# Patient Record
Sex: Female | Born: 1965 | Race: White | Hispanic: No | Marital: Married | Smoking: Never smoker
Health system: Southern US, Community
[De-identification: ages and names within clinical notes are randomized; demographics above are authoritative.]

## PROBLEM LIST (undated history)

## (undated) DIAGNOSIS — I10 Essential (primary) hypertension: Secondary | ICD-10-CM

---

## 1998-10-18 ENCOUNTER — Other Ambulatory Visit: Admission: RE | Admit: 1998-10-18 | Discharge: 1998-10-18 | Payer: Self-pay | Admitting: Obstetrics & Gynecology

## 1998-11-19 ENCOUNTER — Inpatient Hospital Stay (HOSPITAL_COMMUNITY): Admission: AD | Admit: 1998-11-19 | Discharge: 1998-11-19 | Payer: Self-pay | Admitting: Obstetrics and Gynecology

## 1999-05-13 ENCOUNTER — Inpatient Hospital Stay (HOSPITAL_COMMUNITY): Admission: AD | Admit: 1999-05-13 | Discharge: 1999-05-15 | Payer: Self-pay | Admitting: Obstetrics & Gynecology

## 1999-05-13 ENCOUNTER — Encounter (INDEPENDENT_AMBULATORY_CARE_PROVIDER_SITE_OTHER): Payer: Self-pay | Admitting: Specialist

## 1999-06-21 ENCOUNTER — Other Ambulatory Visit: Admission: RE | Admit: 1999-06-21 | Discharge: 1999-06-21 | Payer: Self-pay | Admitting: Obstetrics & Gynecology

## 2000-12-03 ENCOUNTER — Other Ambulatory Visit: Admission: RE | Admit: 2000-12-03 | Discharge: 2000-12-03 | Payer: Self-pay | Admitting: Obstetrics & Gynecology

## 2001-09-10 ENCOUNTER — Other Ambulatory Visit: Admission: RE | Admit: 2001-09-10 | Discharge: 2001-09-10 | Payer: Self-pay | Admitting: Obstetrics & Gynecology

## 2001-12-07 ENCOUNTER — Ambulatory Visit (HOSPITAL_COMMUNITY): Admission: RE | Admit: 2001-12-07 | Discharge: 2001-12-07 | Payer: Self-pay | Admitting: Obstetrics & Gynecology

## 2002-02-25 ENCOUNTER — Inpatient Hospital Stay (HOSPITAL_COMMUNITY): Admission: AD | Admit: 2002-02-25 | Discharge: 2002-03-01 | Payer: Self-pay | Admitting: Obstetrics & Gynecology

## 2002-04-04 ENCOUNTER — Other Ambulatory Visit: Admission: RE | Admit: 2002-04-04 | Discharge: 2002-04-04 | Payer: Self-pay | Admitting: Obstetrics & Gynecology

## 2003-05-19 ENCOUNTER — Other Ambulatory Visit: Admission: RE | Admit: 2003-05-19 | Discharge: 2003-05-19 | Payer: Self-pay | Admitting: Obstetrics & Gynecology

## 2004-09-18 ENCOUNTER — Other Ambulatory Visit: Admission: RE | Admit: 2004-09-18 | Discharge: 2004-09-18 | Payer: Self-pay | Admitting: Obstetrics & Gynecology

## 2007-01-14 ENCOUNTER — Ambulatory Visit (HOSPITAL_COMMUNITY): Admission: RE | Admit: 2007-01-14 | Discharge: 2007-01-14 | Payer: Self-pay | Admitting: Obstetrics & Gynecology

## 2008-09-18 IMAGING — MG MM DIGITAL SCREENING BILAT W/ CAD
4 series · 4 of 4 positions shown · non-contrast
Comparison: none

DG SCREEN MAMMOGRAM BILATERAL
Bilateral CC and MLO view(s) were taken.

DIGITAL SCREENING MAMMOGRAM WITH CAD:
There are scattered fibroglandular densities.  No masses or malignant type calcifications are 
identified.  Compared with prior studies.

[R CC]
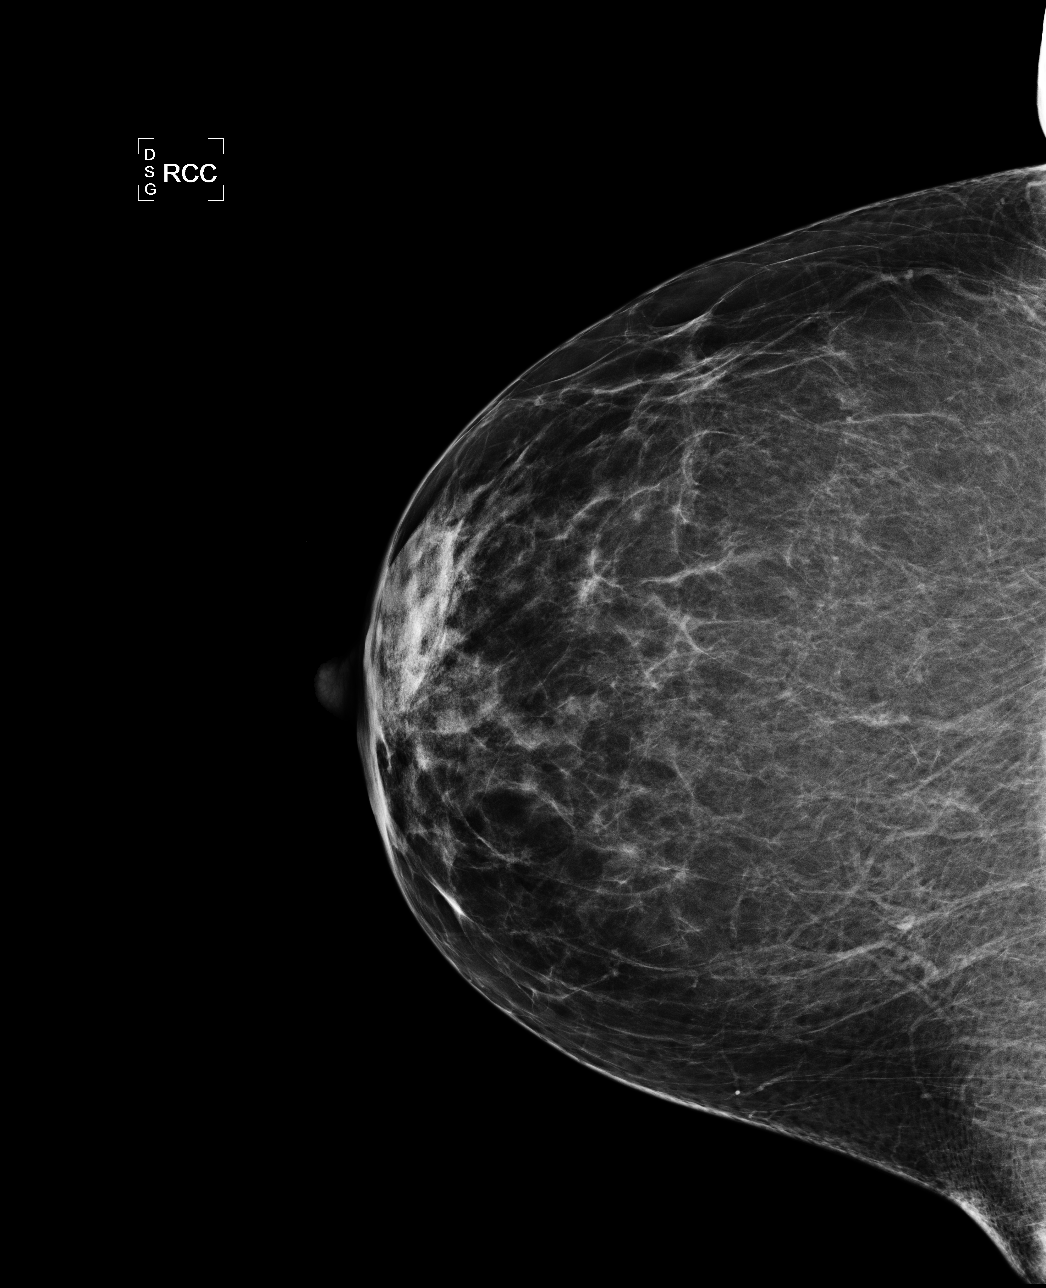

[R MLO]
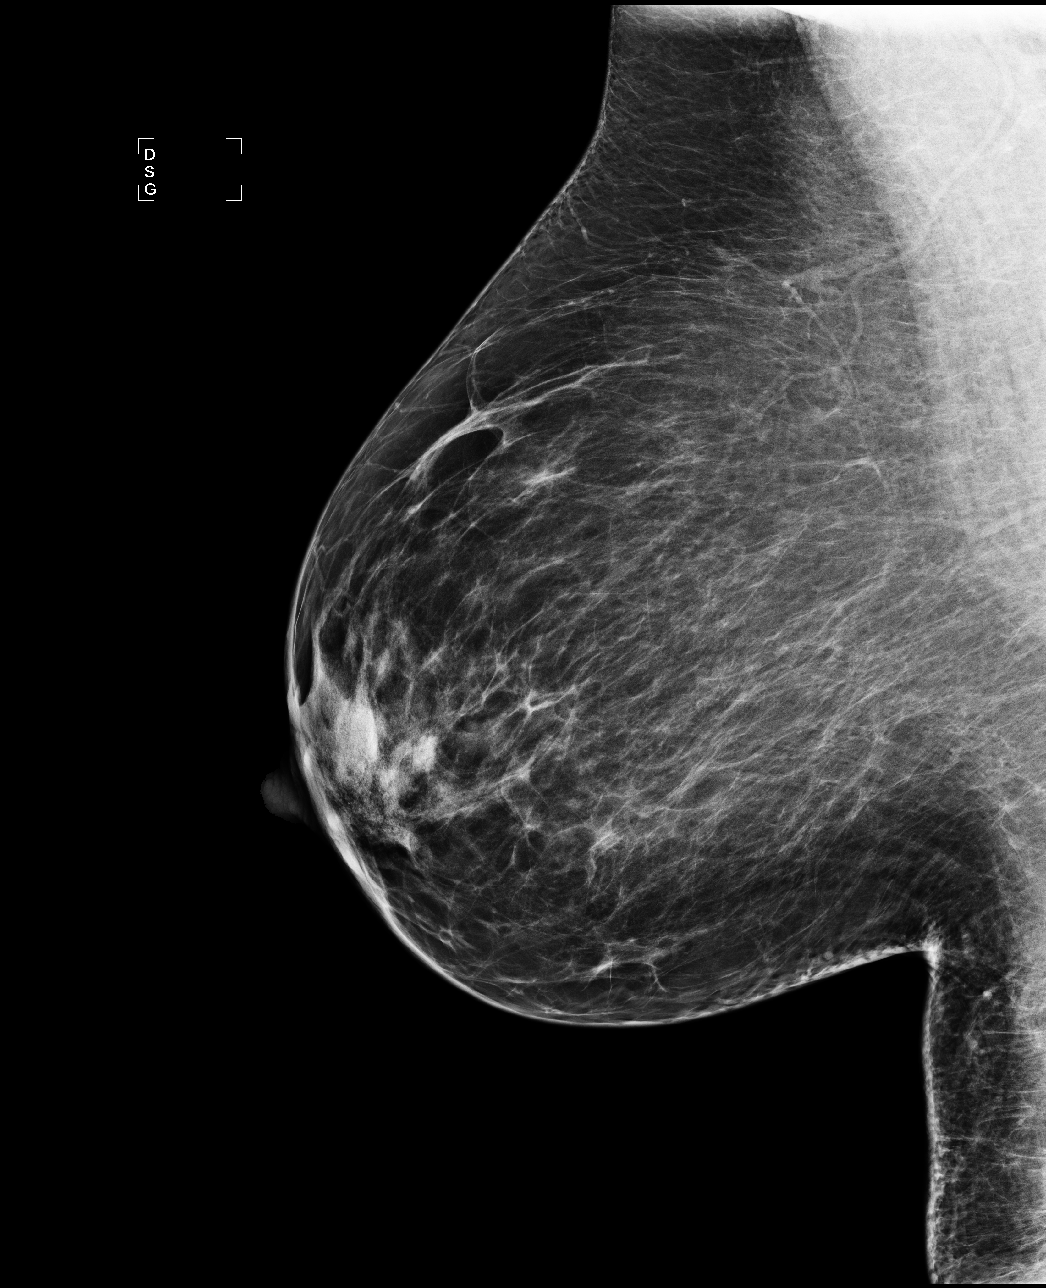

[L CC]
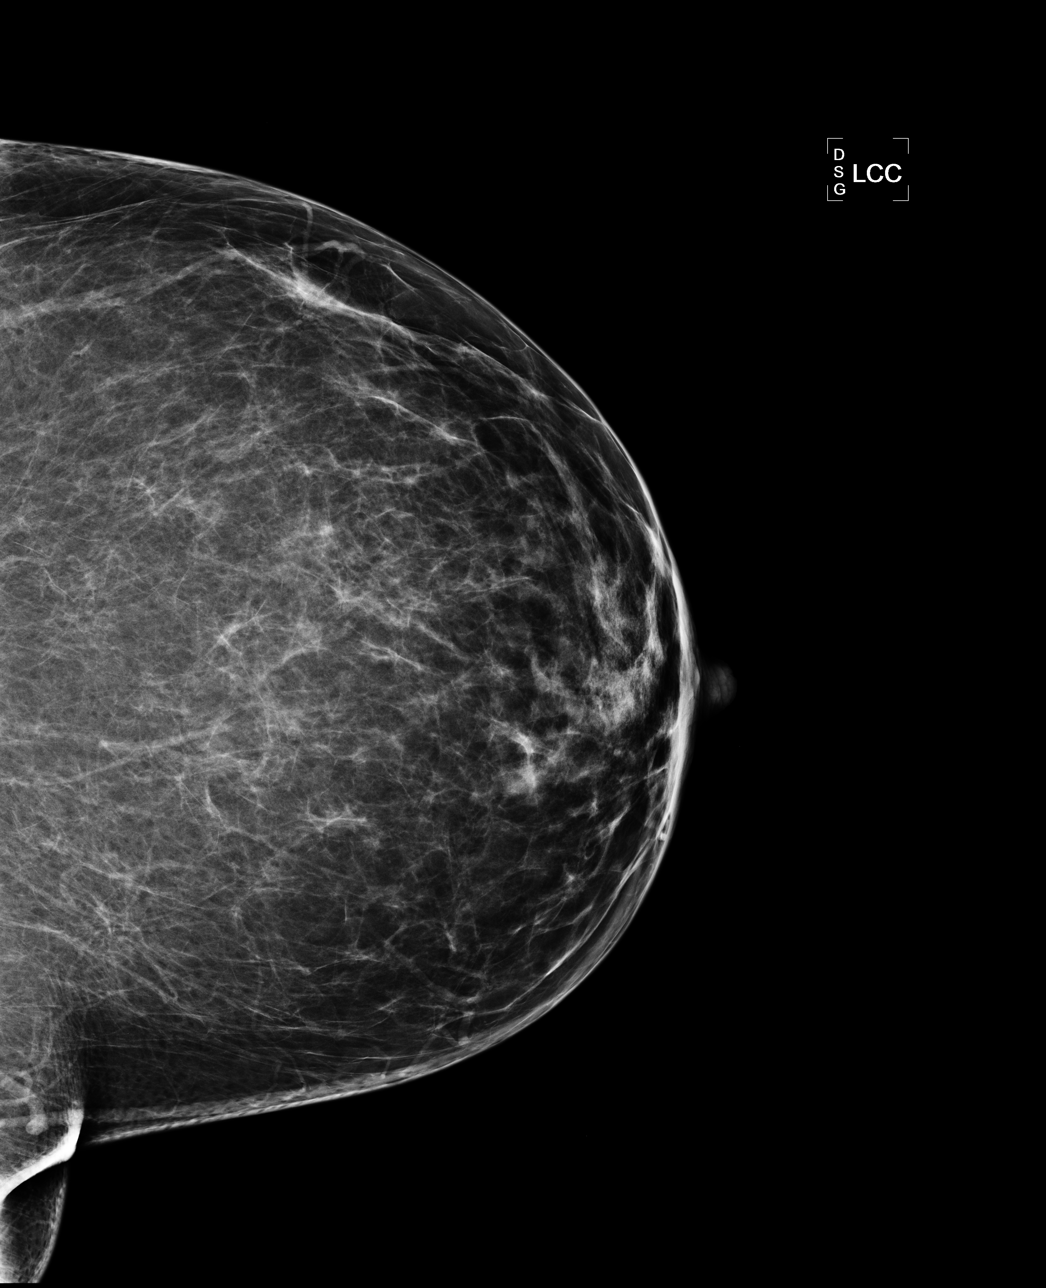

[L MLO]
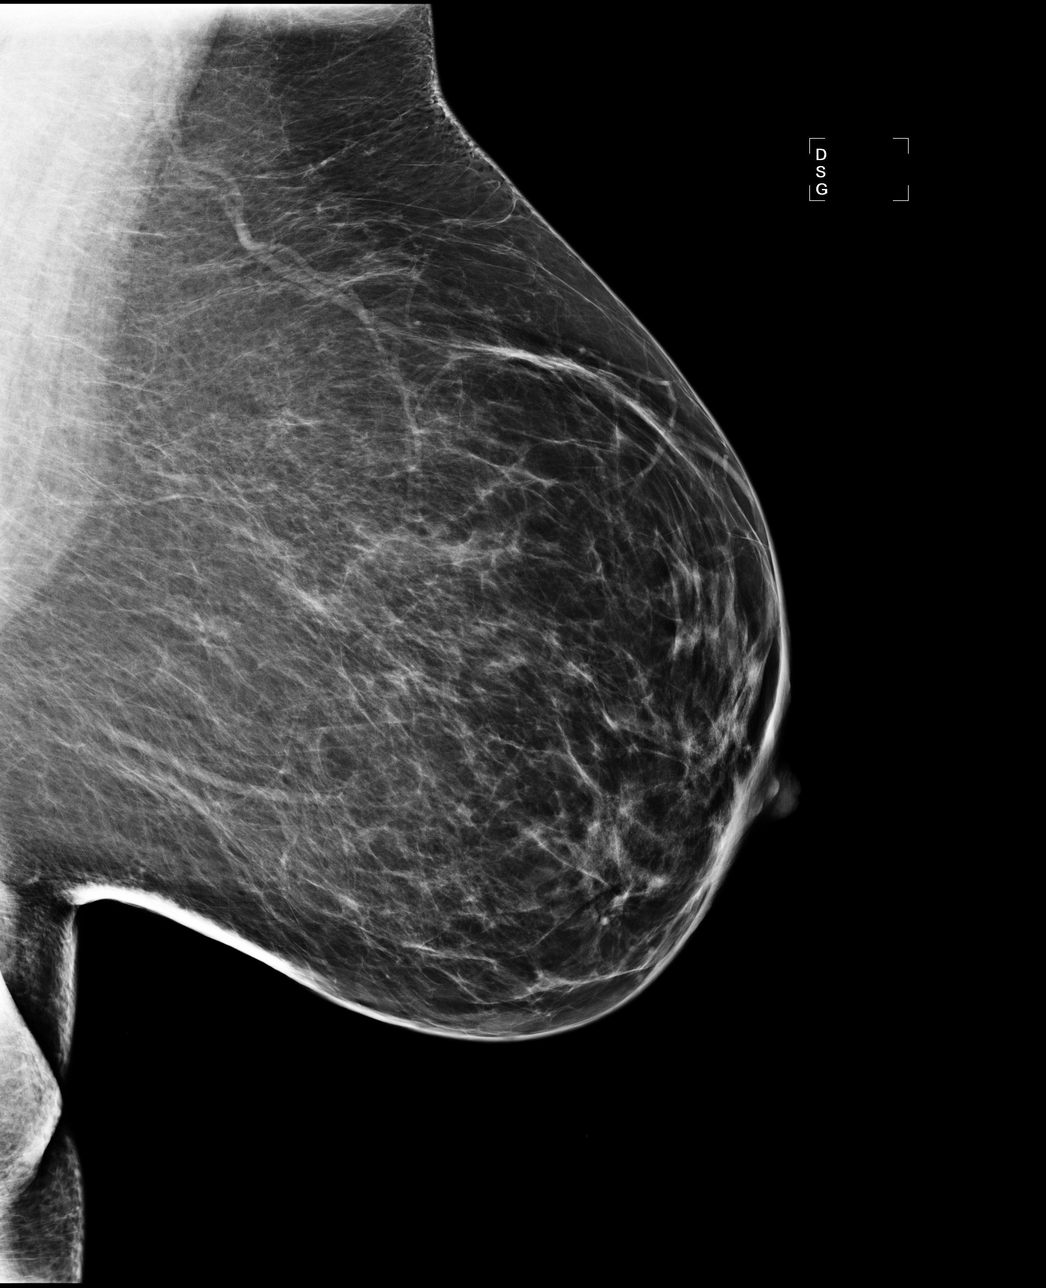

[4 of 4 positions shown; findings below may reference images not displayed]

IMPRESSION: No specific mammographic evidence of malignancy.  Next screening mammogram is recommended in one 
year.

ASSESSMENT: Negative - BI-RADS 1

Screening mammogram in 1 year.
ANALYZED BY COMPUTER AIDED DETECTION. , THIS PROCEDURE WAS A DIGITAL MAMMOGRAM.

## 2010-08-16 NOTE — H&P (Signed)
   NAME:  Alexandra Keller, Alexandra Keller NO.:  [id number]   MEDICAL RECORD NO.:  [id number]                   PATIENT TYPE:  INP   LOCATION:  9167                                 FACILITY:  [facility]   PHYSICIAN:  Tracie Harrier, M.D.              DATE OF BIRTH:  January 17, 1966   DATE OF ADMISSION:  02/25/2002  DATE OF DISCHARGE:                                HISTORY & PHYSICAL   HISTORY OF PRESENT ILLNESS:  The patient is a 45 year old female, gravida 4,  para 3, at [redacted] weeks gestation admitted for induction of labor due to fetal  macrosomia.  The patient had an ultrasound three weeks ago which showed an  estimated fetal weight of 8 pounds 2 ounces.   Her pregnancy has been complicated by advanced maternal age.  She had a  normal AFB with this pregnancy, and declined an amniocentesis.  Her  pregnancy otherwise has been pretty uneventful.  She does have a history of  positive Group B Strep vaginal colonization.   LABORATORY DATA:  Maternal blood type A negative.  Rubella immune.  Positive  Group B Strep.  Glucola unknown.   PAST MEDICAL HISTORY:  History of cervical dysplasia.   PAST SURGICAL HISTORY:  1. Cryotherapy.  2. Wisdom teeth extraction.   PAST OBSTETRICAL HISTORY:  Normal spontaneous vaginal delivery at term x3.   MEDICATIONS:  Prenatal vitamins.   ALLERGIES:  1. E-MYCIN.  2. CODEINE.   PHYSICAL EXAMINATION:  VITAL SIGNS:  Stable.  Temperature 97, blood pressure  110/64, fetal heart tones 140's and reassuring.  GENERAL:  She is a well-developed, well-nourished female in no acute  distress.  HEENT:  Within normal limits.  NECK:  Supple without adenopathy or thyromegaly.  HEART:  Regular rate and rhythm with a 2/6 systolic ejection murmur.  LUNGS:  Clear.  BREASTS:  Deferred.  ABDOMEN:  Gravid and nontender.  EXTREMITIES:  Grossly normal.  NEUROLOGIC:  Grossly normal.  PELVIC:  Normal external female genitalia.  Cervix closed, 50%, vertex  presentation, high.   ADMISSION DIAGNOSES:  1. Intrauterine pregnancy at term.  2. Fetal macrosomia (estimated fetal weight about 9-1/2 pounds).   PLAN:  1. Induction of labor.  2. Intravenous antibiotics for Group B Strep vaginal colonization.  3. Expect normal spontaneous vaginal delivery.                                                 Tracie Harrier, M.D.    REG/MEDQ  D:  02/26/2002  T:  02/26/2002  Job:  [id number]

## 2011-12-24 ENCOUNTER — Emergency Department (HOSPITAL_COMMUNITY)
Admission: EM | Admit: 2011-12-24 | Discharge: 2011-12-24 | Disposition: A | Discharge status: Home / Self Care | Payer: Self-pay | Source: Home / Self Care | Attending: Emergency Medicine | Admitting: Emergency Medicine

## 2011-12-24 ENCOUNTER — Encounter (HOSPITAL_COMMUNITY): Payer: Self-pay

## 2011-12-24 DIAGNOSIS — I10 Essential (primary) hypertension: Secondary | ICD-10-CM

## 2011-12-24 DIAGNOSIS — M7580 Other shoulder lesions, unspecified shoulder: Secondary | ICD-10-CM

## 2011-12-24 LAB — POCT I-STAT, CHEM 8
BUN: 13 mg/dL (ref 6–23)
Calcium, Ion: 1.25 mmol/L — ABNORMAL HIGH (ref 1.12–1.23)
Chloride: 102 mEq/L (ref 96–112)
HCT: 40 % (ref 36.0–46.0)
Sodium: 140 mEq/L (ref 135–145)
TCO2: 25 mmol/L (ref 0–100)

## 2011-12-24 MED ORDER — HYDROCHLOROTHIAZIDE 25 MG PO TABS: 25.0000 mg | ORAL_TABLET | Freq: Every day | ORAL | Status: AC

## 2011-12-24 MED ORDER — TRAMADOL HCL 50 MG PO TABS: 100.0000 mg | ORAL_TABLET | Freq: Three times a day (TID) | ORAL | Status: AC | PRN

## 2011-12-24 NOTE — ED Notes (Signed)
Patient states left arm pain woke her up this morning and that she is having some neck and head pain, co worker took her BP this am was 162/100

## 2011-12-24 NOTE — ED Provider Notes (Signed)
Chief Complaint  Patient presents with  . Hypertension    History of Present Illness:  Alexandra Keller is here today for 2 issues, the first being high blood pressure in the second left shoulder pain.  She has a history of high blood pressure in the past. She was on medication years ago but she stopped it. She has not taken any medications at least 5 years. She got concerned about the shoulder pain and had a coworker take her blood pressure today and it was elevated at 162/101, 162/98, and 162/100. She's had some headaches, blurry vision, and ankles swell. She denies any history or shortness of breath, chest pain, tightness, pressure, palpitations, dizziness, or syncope. She denies any strokelike symptoms. She's had no history of diabetes, elevated cholesterol, cigarette smoking, or kidney disease. Both her parents have high blood pressure and both have coronary artery disease. Her mother died at age 25 following complications of a heart cath. Her father died at age 50 of a fall.  Her second complaint has been pain in the left shoulder. She woke up with it this morning. It hurts to lie on that side. She denies any injury. It hurts with abduction. The pain radiates into the upper arm but not below the elbow. She denies any numbness, tingling, weakness in the arm.  Review of Systems:  Other than noted above, the patient denies any of the following symptoms: Systemic:  No fever, chills, fatigue, weight loss or gain. Respiratory:  No coughing, wheezing, or shortness of breath. Cardiac:  No chest pain, tightness, pressure, palpitations, syncope, or edema. Neuro:  No headache, dizziness, blurred vision, weakness, paresthesias, or strokelike symptoms.  PMFSH:  Past medical history, family history, social history, meds, and allergies were reviewed.  Physical Exam:   Vital signs:  BP 190/82  Pulse 65  Temp 97.9 F (36.6 C) (Oral)  Resp 16  SpO2 100% General:  Alert, oriented, in no distress. Lungs:  Breath  sounds clear and equal bilaterally.  No wheezes, rales, or rhonchi. Heart:  Regular rhythm, no gallops, murmers, clicks or rubs.  Abdomen:  Soft and flat.  Nontender, no organomegaly or mass.  No pulsatile midline abdominal mass or bruit. Ext:  No edema, pulses full. Exam of the left shoulder reveals no pain to palpation and a full range of motion with slight pain on abduction and positive impingement signs.  Labs:   Results for orders placed during the hospital encounter of 12/24/11  POCT I-STAT, CHEM 8      Component Value Range   Sodium 140  135 - 145 mEq/L   Potassium 4.1  3.5 - 5.1 mEq/L   Chloride 102  96 - 112 mEq/L   BUN 13  6 - 23 mg/dL   Creatinine, Ser 1.61  0.50 - 1.10 mg/dL   Glucose, Bld 96  70 - 99 mg/dL   Calcium, Ion 0.96 (*) 1.12 - 1.23 mmol/L   TCO2 25  0 - 100 mmol/L   Hemoglobin 13.6  12.0 - 15.0 g/dL   HCT 04.5  40.9 - 81.1 %     Date: 12/24/2011  Rate: 60  Rhythm: normal sinus rhythm  QRS Axis: normal  Intervals: normal  ST/T Wave abnormalities: normal  Conduction Disutrbances:none  Narrative Interpretation: Normal sinus rhythm, normal EKG.  Old EKG Reviewed: none available  Assessment:  The primary encounter diagnosis was Hypertension. A diagnosis of Rotator cuff tendonitis was also pertinent to this visit.  She needs treatment for her hypertension, so hydrochlorothiazide  was started. I suggested she followup with her primary care physician within the week.  For her rotator cuff tendinitis, want to avoid anti-inflammatories since her blood pressure is so high. I started her on some shoulder exercises and gave her tramadol for the pain. If this keeps on, suggest she return here for followup.  Plan:   1.  The following meds were prescribed:   New Prescriptions   HYDROCHLOROTHIAZIDE (HYDRODIURIL) 25 MG TABLET    Take 1 tablet (25 mg total) by mouth daily.   HYDROCHLOROTHIAZIDE (HYDRODIURIL) 25 MG TABLET    Take 1 tablet (25 mg total) by mouth daily.    TRAMADOL (ULTRAM) 50 MG TABLET    Take 2 tablets (100 mg total) by mouth every 8 (eight) hours as needed for pain.   TRAMADOL (ULTRAM) 50 MG TABLET    Take 2 tablets (100 mg total) by mouth every 8 (eight) hours as needed for pain.   2.  The patient was instructed in symptomatic care and handouts were given. 3.  The patient was told to return if becoming worse in any way, if no better in 3 or 4 days, and given some red flag symptoms that would indicate earlier return.    Reuben Likes, MD 12/24/11 2214

## 2012-04-13 ENCOUNTER — Other Ambulatory Visit: Payer: Self-pay | Admitting: Family Medicine

## 2012-04-13 DIAGNOSIS — Z1231 Encounter for screening mammogram for malignant neoplasm of breast: Secondary | ICD-10-CM

## 2012-04-19 ENCOUNTER — Ambulatory Visit
Admission: RE | Admit: 2012-04-19 | Discharge: 2012-04-19 | Disposition: A | Discharge status: Home / Self Care | Payer: BC Managed Care – PPO | Source: Ambulatory Visit | Attending: Family Medicine | Admitting: Family Medicine

## 2012-04-19 DIAGNOSIS — Z1231 Encounter for screening mammogram for malignant neoplasm of breast: Secondary | ICD-10-CM

## 2013-11-20 ENCOUNTER — Encounter (HOSPITAL_BASED_OUTPATIENT_CLINIC_OR_DEPARTMENT_OTHER): Payer: Self-pay | Admitting: Emergency Medicine

## 2013-11-20 ENCOUNTER — Emergency Department (HOSPITAL_BASED_OUTPATIENT_CLINIC_OR_DEPARTMENT_OTHER): Payer: Managed Care, Other (non HMO)

## 2013-11-20 ENCOUNTER — Emergency Department (HOSPITAL_BASED_OUTPATIENT_CLINIC_OR_DEPARTMENT_OTHER)
Admission: EM | Admit: 2013-11-20 | Discharge: 2013-11-20 | Disposition: A | Discharge status: Home / Self Care | Payer: Managed Care, Other (non HMO) | Attending: Emergency Medicine | Admitting: Emergency Medicine

## 2013-11-20 DIAGNOSIS — Y929 Unspecified place or not applicable: Secondary | ICD-10-CM | POA: Diagnosis not present

## 2013-11-20 DIAGNOSIS — Y9301 Activity, walking, marching and hiking: Secondary | ICD-10-CM | POA: Insufficient documentation

## 2013-11-20 DIAGNOSIS — S8000XA Contusion of unspecified knee, initial encounter: Secondary | ICD-10-CM | POA: Diagnosis not present

## 2013-11-20 DIAGNOSIS — W1809XA Striking against other object with subsequent fall, initial encounter: Secondary | ICD-10-CM | POA: Insufficient documentation

## 2013-11-20 DIAGNOSIS — IMO0002 Reserved for concepts with insufficient information to code with codable children: Secondary | ICD-10-CM | POA: Insufficient documentation

## 2013-11-20 DIAGNOSIS — S199XXA Unspecified injury of neck, initial encounter: Secondary | ICD-10-CM | POA: Diagnosis not present

## 2013-11-20 DIAGNOSIS — S0990XA Unspecified injury of head, initial encounter: Secondary | ICD-10-CM | POA: Diagnosis present

## 2013-11-20 DIAGNOSIS — W19XXXA Unspecified fall, initial encounter: Secondary | ICD-10-CM

## 2013-11-20 DIAGNOSIS — S0181XA Laceration without foreign body of other part of head, initial encounter: Secondary | ICD-10-CM

## 2013-11-20 DIAGNOSIS — S0180XA Unspecified open wound of other part of head, initial encounter: Secondary | ICD-10-CM | POA: Diagnosis not present

## 2013-11-20 DIAGNOSIS — S8001XA Contusion of right knee, initial encounter: Secondary | ICD-10-CM

## 2013-11-20 DIAGNOSIS — I10 Essential (primary) hypertension: Secondary | ICD-10-CM | POA: Insufficient documentation

## 2013-11-20 DIAGNOSIS — Z79899 Other long term (current) drug therapy: Secondary | ICD-10-CM | POA: Insufficient documentation

## 2013-11-20 DIAGNOSIS — S0993XA Unspecified injury of face, initial encounter: Secondary | ICD-10-CM | POA: Insufficient documentation

## 2013-11-20 DIAGNOSIS — W010XXA Fall on same level from slipping, tripping and stumbling without subsequent striking against object, initial encounter: Secondary | ICD-10-CM | POA: Insufficient documentation

## 2013-11-20 HISTORY — DX: Essential (primary) hypertension: I10

## 2013-11-20 MED ORDER — HYDROCODONE-ACETAMINOPHEN 5-325 MG PO TABS
1.0000 | ORAL_TABLET | Freq: Once | ORAL | Status: AC
  Administered 2013-11-20: 1 via ORAL
  Filled 2013-11-20: qty 1

## 2013-11-20 MED ORDER — HYDROCODONE-ACETAMINOPHEN 5-325 MG PO TABS: 1.0000 | ORAL_TABLET | ORAL | Status: AC | PRN

## 2013-11-20 NOTE — ED Notes (Signed)
Patient transported to CT 

## 2013-11-20 NOTE — ED Notes (Signed)
Pt reports tripping over uneven concrete this evening causing her to fall and hit her head on the ground - pt sustained laceration to rt eye brow, bleeding ceased at this time. Pt denies any LOC, dizziness or n/v.

## 2013-11-20 NOTE — ED Provider Notes (Signed)
CSN: [id number]     Arrival date & time 11/20/13  2011 History   First MD Initiated Contact with Patient 11/20/13 2043     Chief Complaint  Patient presents with  . Facial Laceration  . Head Injury     (Consider location/radiation/quality/duration/timing/severity/associated sxs/prior Treatment) Patient is a 48 y.o. female presenting with head injury. The history is provided by the patient. No language interpreter was used.  Head Injury Location:  R temporal Mechanism of injury: fall   Pain details:    Severity:  Moderate Chronicity:  New Associated symptoms: headache and neck pain   Associated symptoms: no nausea and no vomiting   Associated symptoms comment:  She fell while walking, hitting her right side on concrete causing facial laceration and headache. She denies visual changes but reports photophobia. No LOC. She denies N, V. She complains of mild neck pain - worse over time - bilateral hand abrasions and right knee pain. She has been ambulatory since the fall. No chest/abdominal pain.   Past Medical History  Diagnosis Date  . Hypertension    History reviewed. No pertinent past surgical history. History reviewed. No pertinent family history. History  Substance Use Topics  . Smoking status: Never Smoker   . Smokeless tobacco: Not on file  . Alcohol Use: Yes   OB History   Grav Para Term Preterm Abortions TAB SAB Ect Mult Living                 Review of Systems  Constitutional: Negative for fever and fatigue.  Eyes: Positive for photophobia. Negative for visual disturbance.  Respiratory: Negative for shortness of breath.   Cardiovascular: Negative for chest pain.  Gastrointestinal: Negative for nausea, vomiting and abdominal pain.  Musculoskeletal: Positive for neck pain. Negative for back pain.       See HPI.  Skin: Positive for wound.  Neurological: Positive for headaches. Negative for syncope.  Psychiatric/Behavioral: Negative for confusion.       Allergies  Codeine and Erythromycin  Home Medications   Prior to Admission medications   Medication Sig Start Date End Date Taking? Authorizing Provider  FLUoxetine (PROZAC) 20 MG capsule Take 20 mg by mouth daily.   Yes Historical Provider, MD  lisinopril (PRINIVIL,ZESTRIL) 20 MG tablet Take 20 mg by mouth daily.   Yes Historical Provider, MD  omeprazole (PRILOSEC) 10 MG capsule Take 10 mg by mouth daily.   Yes Historical Provider, MD  traMADol (ULTRAM) 50 MG tablet Take 2 tablets (100 mg total) by mouth every 8 (eight) hours as needed for pain. 12/24/11  Yes Reuben Likes, MD  hydrochlorothiazide (HYDRODIURIL) 25 MG tablet Take 1 tablet (25 mg total) by mouth daily. 12/24/11   Reuben Likes, MD  hydrochlorothiazide (HYDRODIURIL) 25 MG tablet Take 1 tablet (25 mg total) by mouth daily. 12/24/11   Reuben Likes, MD  traMADol (ULTRAM) 50 MG tablet Take 2 tablets (100 mg total) by mouth every 8 (eight) hours as needed for pain. 12/24/11   Reuben Likes, MD   BP 133/75  Pulse 87  Temp(Src) 97.8 F (36.6 C)  Resp 18  Ht  (1.626 m)  Wt 230 lb (104.327 kg)  BMI 39.46 kg/m2  SpO2 100%  LMP 10/20/2013 Physical Exam  Constitutional: She is oriented to person, place, and time. She appears well-developed and well-nourished.  Neck: Normal range of motion.  Pulmonary/Chest: Effort normal. She exhibits no tenderness.  Abdominal: There is no tenderness.  Musculoskeletal: Normal range  of motion.  Right knee: ecchymosis anteriorly. No bony deformity. Joint stable without laxity. Normal tendon function. Hands: FROM all digits and wrists.  Neck: Mild midline and bilateral paracervical tenderness. No swelling. FROM.   Neurological: She is alert and oriented to person, place, and time.  Skin: Skin is warm and dry.  1 cm laceration right lateral eye brow without hematoma. Superficial abrasions to palms of hands bilaterally.    ED Course  Procedures (including critical care  time) Labs Review Labs Reviewed - No data to display  Imaging Review No results found. Ct Head Wo Contrast  11/20/2013   CLINICAL DATA:  Right eyebrow laceration after falling and hitting her head on the ground.  EXAM: CT HEAD WITHOUT CONTRAST  CT CERVICAL SPINE WITHOUT CONTRAST  TECHNIQUE: Multidetector CT imaging of the head and cervical spine was performed following the standard protocol without intravenous contrast. Multiplanar CT image reconstructions of the cervical spine were also generated.  COMPARISON:  None.  FINDINGS: CT HEAD FINDINGS  Normal appearing cerebral hemispheres and posterior fossa structures. Normal size and position of the ventricles. No skull fracture, intracranial hemorrhage or paranasal sinus air-fluid levels.  CT CERVICAL SPINE FINDINGS  Mild dextro convex cervicothoracic scoliosis. Reversal of the normal cervical lordosis. Moderate anterior and posterior spur formation at the C5-6 level. Minimal anterior and mild posterior spur formation at the C4-5 level. No prevertebral soft tissue swelling, fractures or subluxations.  IMPRESSION: 1. Normal head CT. 2. No cervical spine fracture or subluxation. 3. Cervical spine degenerative changes, reversal of the normal cervical lordosis and mild scoliosis.   Electronically Signed   By: Gordan Payment M.D.   On: 11/20/2013 22:24   Ct Cervical Spine Wo Contrast  11/20/2013   CLINICAL DATA:  Right eyebrow laceration after falling and hitting her head on the ground.  EXAM: CT HEAD WITHOUT CONTRAST  CT CERVICAL SPINE WITHOUT CONTRAST  TECHNIQUE: Multidetector CT imaging of the head and cervical spine was performed following the standard protocol without intravenous contrast. Multiplanar CT image reconstructions of the cervical spine were also generated.  COMPARISON:  None.  FINDINGS: CT HEAD FINDINGS  Normal appearing cerebral hemispheres and posterior fossa structures. Normal size and position of the ventricles. No skull fracture, intracranial  hemorrhage or paranasal sinus air-fluid levels.  CT CERVICAL SPINE FINDINGS  Mild dextro convex cervicothoracic scoliosis. Reversal of the normal cervical lordosis. Moderate anterior and posterior spur formation at the C5-6 level. Minimal anterior and mild posterior spur formation at the C4-5 level. No prevertebral soft tissue swelling, fractures or subluxations.  IMPRESSION: 1. Normal head CT. 2. No cervical spine fracture or subluxation. 3. Cervical spine degenerative changes, reversal of the normal cervical lordosis and mild scoliosis.   Electronically Signed   By: Gordan Payment M.D.   On: 11/20/2013 22:24    EKG Interpretation None     LACERATION REPAIR Performed by: Elpidio Anis A Authorized by: Elpidio Anis A Consent: Verbal consent obtained. Risks and benefits: risks, benefits and alternatives were discussed Consent given by: patient Patient identity confirmed: provided demographic data Prepped and Draped in normal sterile fashion Wound explored  Laceration Location: right lateral eye brow  Laceration Length: 2cm  No Foreign Bodies seen or palpated  Anesthesia: local infiltration  Local anesthetic: lidocaine 2% w/o epinephrine  Anesthetic total: 1 ml  Irrigation method: syringe Amount of cleaning: standard  Skin closure: 6-0 prolene  Number of sutures: 4  Technique: simple interrupted  Patient tolerance: Patient tolerated the procedure well  with no immediate complications.  MDM   Final diagnoses:  None    1. Facial laceration 2. Fall 3. Knee contusion  No evidence IC head injury or neck fracture on CT's. Laceration repaired. Feel injuries result of mechanical fall. Stable for discharge.   Arnoldo Hooker, PA-C 11/20/13 2317

## 2013-11-20 NOTE — Discharge Instructions (Signed)
Cryotherapy °Cryotherapy means treatment with cold. Ice or gel packs can be used to reduce both pain and swelling. Ice is the most helpful within the first 24 to 48 hours after an injury or flare-up from overusing a muscle or joint. Sprains, strains, spasms, burning pain, shooting pain, and aches can all be eased with ice. Ice can also be used when recovering from surgery. Ice is effective, has very few side effects, and is safe for most people to use. °PRECAUTIONS  °Ice is not a safe treatment option for people with: °· Raynaud phenomenon. This is a condition affecting small blood vessels in the extremities. Exposure to cold may cause your problems to return. °· Cold hypersensitivity. There are many forms of cold hypersensitivity, including: °¨ Cold urticaria. Red, itchy hives appear on the skin when the tissues begin to warm after being iced. °¨ Cold erythema. This is a red, itchy rash caused by exposure to cold. °¨ Cold hemoglobinuria. Red blood cells break down when the tissues begin to warm after being iced. The hemoglobin that carry oxygen are passed into the urine because they cannot combine with blood proteins fast enough. °· Numbness or altered sensitivity in the area being iced. °If you have any of the following conditions, do not use ice until you have discussed cryotherapy with your caregiver: °· Heart conditions, such as arrhythmia, angina, or chronic heart disease. °· High blood pressure. °· Healing wounds or open skin in the area being iced. °· Current infections. °· Rheumatoid arthritis. °· Poor circulation. °· Diabetes. °Ice slows the blood flow in the region it is applied. This is beneficial when trying to stop inflamed tissues from spreading irritating chemicals to surrounding tissues. However, if you expose your skin to cold temperatures for too long or without the proper protection, you can damage your skin or nerves. Watch for signs of skin damage due to cold. °HOME CARE INSTRUCTIONS °Follow  these tips to use ice and cold packs safely. °· Place a dry or damp towel between the ice and skin. A damp towel will cool the skin more quickly, so you may need to shorten the time that the ice is used. °· For a more rapid response, add gentle compression to the ice. °· Ice for no more than 10 to 20 minutes at a time. The bonier the area you are icing, the less time it will take to get the benefits of ice. °· Check your skin after 5 minutes to make sure there are no signs of a poor response to cold or skin damage. °· Rest 20 minutes or more between uses. °· Once your skin is numb, you can end your treatment. You can test numbness by very lightly touching your skin. The touch should be so light that you do not see the skin dimple from the pressure of your fingertip. When using ice, most people will feel these normal sensations in this order: cold, burning, aching, and numbness. °· Do not use ice on someone who cannot communicate their responses to pain, such as small children or people with dementia. °HOW TO MAKE AN ICE PACK °Ice packs are the most common way to use ice therapy. Other methods include ice massage, ice baths, and cryosprays. Muscle creams that cause a cold, tingly feeling do not offer the same benefits that ice offers and should not be used as a substitute unless recommended by your caregiver. °To make an ice pack, do one of the following: °· Place crushed ice or a   bag of frozen vegetables in a sealable plastic bag. Squeeze out the excess air. Place this bag inside another plastic bag. Slide the bag into a pillowcase or place a damp towel between your skin and the bag.  Mix 3 parts water with 1 part rubbing alcohol. Freeze the mixture in a sealable plastic bag. When you remove the mixture from the freezer, it will be slushy. Squeeze out the excess air. Place this bag inside another plastic bag. Slide the bag into a pillowcase or place a damp towel between your skin and the bag. SEEK MEDICAL CARE  IF:  You develop white spots on your skin. This may give the skin a blotchy (mottled) appearance.  Your skin turns blue or pale.  Your skin becomes waxy or hard.  Your swelling gets worse. MAKE SURE YOU:   Understand these instructions.  Will watch your condition.  Will get help right away if you are not doing well or get worse. Document Released: 11/11/2010 Document Revised: 08/01/2013 Document Reviewed: 11/11/2010 [facility] Patient Information 2015 [place], [place]. This information is not intended to replace advice given to you by your health care provider. Make sure you discuss any questions you have with your health care provider. Contusion A contusion is a deep bruise. Contusions are the result of an injury that caused bleeding under the skin. The contusion may turn blue, purple, or yellow. Minor injuries will give you a painless contusion, but more severe contusions may stay painful and swollen for a few weeks.  CAUSES  A contusion is usually caused by a blow, trauma, or direct force to an area of the body. SYMPTOMS   Swelling and redness of the injured area.  Bruising of the injured area.  Tenderness and soreness of the injured area.  Pain. DIAGNOSIS  The diagnosis can be made by taking a history and physical exam. An X-ray, CT scan, or MRI may be needed to determine if there were any associated injuries, such as fractures. TREATMENT  Specific treatment will depend on what area of the body was injured. In general, the best treatment for a contusion is resting, icing, elevating, and applying cold compresses to the injured area. Over-the-counter medicines may also be recommended for pain control. Ask your caregiver what the best treatment is for your contusion. HOME CARE INSTRUCTIONS   Put ice on the injured area.  Put ice in a plastic bag.  Place a towel between your skin and the bag.  Leave the ice on for 15-20 minutes, 3-4 times a day, or as directed by your health  care provider.  Only take over-the-counter or prescription medicines for pain, discomfort, or fever as directed by your caregiver. Your caregiver may recommend avoiding anti-inflammatory medicines (aspirin, ibuprofen, and naproxen) for 48 hours because these medicines may increase bruising.  Rest the injured area.  If possible, elevate the injured area to reduce swelling. SEEK IMMEDIATE MEDICAL CARE IF:   You have increased bruising or swelling.  You have pain that is getting worse.  Your swelling or pain is not relieved with medicines. MAKE SURE YOU:   Understand these instructions.  Will watch your condition.  Will get help right away if you are not doing well or get worse. Document Released: 12/25/2004 Document Revised: 03/22/2013 Document Reviewed: 01/20/2011 [facility] Patient Information 2015 [place], [place]. This information is not intended to replace advice given to you by your health care provider. Make sure you discuss any questions you have with your health care provider. Sutured Wound  Care Sutures are stitches that can be used to close wounds. Wound care helps prevent pain and infection.  HOME CARE INSTRUCTIONS   Rest and elevate the injured area until all the pain and swelling are gone.  Only take over-the-counter or prescription medicines for pain, discomfort, or fever as directed by your caregiver.  After 48 hours, gently wash the area with mild soap and water once a day, or as directed. Rinse off the soap. Pat the area dry with a clean towel. Do not rub the wound. This may cause bleeding.  Follow your caregiver's instructions for how often to change the bandage (dressing). Stop using a dressing after 2 days or after the wound stops draining.  If the dressing sticks, moisten it with soapy water and gently remove it.  Apply ointment on the wound as directed.  Avoid stretching a sutured wound.  Drink enough fluids to keep your urine clear or pale yellow.  Follow  up with your caregiver for suture removal as directed.  Use sunscreen on your wound for the next 3 to 6 months so the scar will not darken. SEEK IMMEDIATE MEDICAL CARE IF:   Your wound becomes red, swollen, hot, or tender.  You have increasing pain in the wound.  You have a red streak that extends from the wound.  There is pus coming from the wound.  You have a fever.  You have shaking chills.  There is a bad smell coming from the wound.  You have persistent bleeding from the wound. MAKE SURE YOU:   Understand these instructions.  Will watch your condition.  Will get help right away if you are not doing well or get worse. Document Released: 04/24/2004 Document Revised: 06/09/2011 Document Reviewed: 07/21/2010 [facility] Patient Information 2015 [place], [place]. This information is not intended to replace advice given to you by your health care provider. Make sure you discuss any questions you have with your health care provider.

## 2013-11-22 NOTE — ED Provider Notes (Signed)
Medical screening examination/treatment/procedure(s) were performed by non-physician practitioner and as supervising physician I was immediately available for consultation/collaboration.   EKG Interpretation None        Rolland Porter, MD 11/22/13 [phone]

## 2017-09-25 ENCOUNTER — Other Ambulatory Visit: Payer: Self-pay | Admitting: Family Medicine

## 2017-10-30 ENCOUNTER — Other Ambulatory Visit: Payer: Self-pay | Admitting: Family Medicine
